# Patient Record
Sex: Female | Born: 1970
Health system: Southern US, Community
[De-identification: ages and names within clinical notes are randomized; demographics above are authoritative.]

---

## 1999-07-14 ENCOUNTER — Other Ambulatory Visit: Admission: RE | Admit: 1999-07-14 | Discharge: 1999-07-14 | Payer: Self-pay | Admitting: Obstetrics and Gynecology

## 2000-01-26 ENCOUNTER — Inpatient Hospital Stay (HOSPITAL_COMMUNITY): Admission: AD | Admit: 2000-01-26 | Discharge: 2000-01-29 | Payer: Self-pay | Admitting: Obstetrics and Gynecology

## 2000-01-26 ENCOUNTER — Encounter: Payer: Self-pay | Admitting: Obstetrics and Gynecology

## 2000-01-26 ENCOUNTER — Encounter (INDEPENDENT_AMBULATORY_CARE_PROVIDER_SITE_OTHER): Payer: Self-pay

## 2000-03-05 ENCOUNTER — Other Ambulatory Visit: Admission: RE | Admit: 2000-03-05 | Discharge: 2000-03-05 | Payer: Self-pay | Admitting: Obstetrics and Gynecology

## 2000-03-15 ENCOUNTER — Encounter: Admission: RE | Admit: 2000-03-15 | Discharge: 2000-06-13 | Payer: Self-pay | Admitting: Obstetrics and Gynecology

## 2003-07-15 ENCOUNTER — Other Ambulatory Visit: Admission: RE | Admit: 2003-07-15 | Discharge: 2003-07-15 | Payer: Self-pay | Admitting: Obstetrics and Gynecology

## 2004-02-11 ENCOUNTER — Encounter (INDEPENDENT_AMBULATORY_CARE_PROVIDER_SITE_OTHER): Payer: Self-pay | Admitting: *Deleted

## 2004-02-11 ENCOUNTER — Inpatient Hospital Stay (HOSPITAL_COMMUNITY): Admission: AD | Admit: 2004-02-11 | Discharge: 2004-02-14 | Payer: Self-pay | Admitting: Obstetrics and Gynecology

## 2008-12-03 ENCOUNTER — Encounter: Admission: RE | Admit: 2008-12-03 | Discharge: 2008-12-03 | Payer: Self-pay | Admitting: Obstetrics and Gynecology

## 2010-12-08 NOTE — Discharge Summary (Signed)
[name], [name]                         ACCOUNT NO.:  [id number]   MEDICAL RECORD NO.:  [id number]                   PATIENT TYPE:  INP   LOCATION:  9110                                 FACILITY:  [facility]   PHYSICIAN:  Ilda Mori, M.D.                DATE OF BIRTH:  12/26/1970   DATE OF ADMISSION:  02/11/2004  DATE OF DISCHARGE:  02/14/2004                                 DISCHARGE SUMMARY   FINAL DIAGNOSES:  1. Intrauterine pregnancy at term.  2. Active labor.  3. Breech presentation.  4. Desire for permanent sterilization.   PROCEDURE:  Primary low transverse cesarean section and bilateral tubal  ligation using Pomeroy method.  Surgeon:  Dr. Annamaria Helling.  Complications:  None.   This 40 year old G2 P1 presents on the morning of February 11, 2004 in active  labor.  The patient was about 5 cm dilated upon admission.  The patient's  antepartum course up to this point had been uncomplicated.  She did have  negative group B strep cultures obtained in the office.  The patient was  admitted.  She was allowed to labor.  She progressed to about 9 cm of  dilation at which point she had rupture of membranes.  At this point it  became obvious the baby's presentation was not vertex and an ultrasound  confirmed a breech presentation at that time.  A discussion was carried out  with the patient and her husband and decision was made to proceed with a  cesarean section secondary to this presentation and for a tubal  sterilization procedure as well.  The patient was taken to the operating  room on February 11, 2004 where a primary low transverse cesarean section was  performed with the delivery of a 6-pound 7-ounce female infant with Apgars  of 8 and 9.  At this point tubal sterilization procedure was performed  without complications.  The patient's postoperative course was benign  without significant fevers.  She was felt ready for discharge on  postoperative day #3.  She was sent home on a  regular diet, told to decrease  activities, told to continue prenatal vitamins, was given Tylox #25 one to  two q.4h. as needed for pain, was told she could use over-the-counter Motrin  as needed, was to follow up in the office in 4 weeks.   LABORATORY DATA ON DISCHARGE:  The patient had a hemoglobin of 11.0, white  blood cell count of 16.3.     Leilani Able, P.A.-C.                Ilda Mori, M.D.    MB/MEDQ  D:  03/09/2004  T:  03/09/2004  Job:  [id number]

## 2010-12-08 NOTE — Op Note (Signed)
NAMEASHMI, Lisa Benson                         ACCOUNT NO.:  [id number]   MEDICAL RECORD NO.:  [id number]                   PATIENT TYPE:  INP   LOCATION:  9160                                 FACILITY:  [facility]   PHYSICIAN:  Gerrit Friends. Aldona Bar, M.D.                DATE OF BIRTH:  [birth year]   DATE OF PROCEDURE:  02/11/2004  DATE OF DISCHARGE:                                 OPERATIVE REPORT   PREOPERATIVE DIAGNOSES:  Term pregnancy active labor breech presentation and  desire for permanent elective sterilization.   POSTOPERATIVE DIAGNOSES:  Term pregnancy active labor breech presentation  and desire for permanent elective sterilization.  Delivery of 6 pound 7  ounce female infant Apgar's of 8 & 9 and pathology pending on segments of  each fallopian tube.   PROCEDURE:  Primary low transverse cesarean section and Pomeroy tubal  sterilization for permanent elective sterilization.   SURGEON:  Gerrit Friends. Aldona Bar, M.D.   ANESTHESIA:  Subarachnoid block, Quillian Quince, M.D.   HISTORY:  This 40 year old, gravida 2, para 1 presented to [facility]  on the morning of July 22 in labor. When I first examined her, she was about  5 cm dilated, contracting, comfortable and on examination membranes were  intact and the presentations felt like perhaps __________ vertex.  The  patient was allowed to labor and progress to about 9 cm of dilatation at  which time I examined her again and ruptured her membranes. It became  obvious at that time that the baby's presentation was probably not vertex.  An ultrasound confirmed a breech and most likely a frank breech. A  discussion was carried out at this time with the patient and her husband and  a decision was made to proceed with a low transverse cesarean section for  delivery and accomplish a tubal sterilization procedure as well as the  patient was desirous of a permanent elective sterilization procedure. She  was taken to the operating room where a  subarachnoid block was carried out  by Dr. Arby Barrette.   DESCRIPTION OF PROCEDURE:  Once in the operating room and once the  subarachnoid block had been carried out, a Foley catheter was placed. At  this time, the patient was placed in a supine position slightly tilted to  the left and after good anesthetic levels were noted with the patient being  prepped and draped according the procedure was begun.   A Pfannenstiel incision was made and with minimal difficulty dissected down  sharply to and through the fascia in a low transverse fashion with  hemostasis created at each layer. The subfascial space was created  inferiorly and superiorly, muscle separated in the midline, peritoneum  identified and entered appropriately with care taken to avoid the bladder  inferiorly and the bowel superiorly. At this time, the vesicouterine  peritoneum was incised in a low transverse fashion and pushed off the lower  uterine  segment with ease. Sharp incisions to the lower segment was then  carried out using the Metzenbaum scissors and extended laterally without  difficulty. At this time from a frank breech presentation, a viable female  infant which cried spontaneously once it was delivered using a modified  Loveset maneuver.  Once the baby was delivered and the cord was clamped and  cut the infant was passed off to the waiting team. Subsequent weight was  found to be 6 pounds 7 ounces, Apgar's 8 & 9 and the baby went to the  nursery in good condition.   After the placenta was delivered intact, the uterus delivered from the  abdominal incision and rendered free of any remaining products of  conception. Good uterine contractility was afforded, was slowly given  intravenous Pitocin and manual stimulation. At this time, the uterine  incision was closed using #1 Vicryl in a running locking fashion. This was  oversewn with several figure-of-eight #1 Vicryl's.  Attention at this time  was turned to the tubes  and ovaries which appeared completely normal. A  segment was elevated in the mid portion of the left fallopian tube and a  free tie of #1 plain catgut suture tied down about the knuckle and the  knuckle was excised and sent to pathology with good hemostasis noted.  A  similar procedure was carried out on the right fallopian tube.   At this time, the uterine incision was again inspected and noted to be  hemostatic.  The uterus this time was replaced into the abdominal cavity and  after the abdomen was lavaged of all free blood and clot and after the  vesicouterine peritoneum had been reapproximated loosely using interrupted  #0 Vicryl, closure of the abdomen was begun.  The abdominoperitoneum was  closed using #0 Vicryl in a running fashion and muscle secured with same.  This was accomplished after all counts were noted to be correct and no  foreign bodies were noted to be remaining in the abdominal cavity. At this  time assured of good fascial hemostasis, the fascia was then reapproximated  using #0 Vicryl from angle to midline bilaterally. The subcutaneous tissue  was rendered hemostatic and staples were then used to close the skin. A  sterile pressure dressing was applied and the patient at this time was  transported to the recovery room in satisfactory condition having tolerated  the procedure well.  Estimated blood loss 500 mL. All counts correct x2.  Pathologic specimen consisted of a segment of each fallopian tube.   In summary, this patient presented in labor at term, was found to have a  breech presentation and was taken to the operating room for a primary low  transverse cesarean section for delivery and at the same time underwent a  tubal sterilization procedure according to her wishes.  At the conclusion of  the procedure, both mother and baby were doing well in their respective  recovery areas.                                               Gerrit Friends. Aldona Bar, M.D.   RMW/MEDQ   D:  02/11/2004  T:  02/12/2004  Job:  [id number]

## 2011-03-27 ENCOUNTER — Other Ambulatory Visit: Payer: Self-pay | Admitting: Obstetrics & Gynecology

## 2012-04-23 ENCOUNTER — Other Ambulatory Visit: Payer: Self-pay | Admitting: Obstetrics & Gynecology

## 2012-04-23 DIAGNOSIS — Z1231 Encounter for screening mammogram for malignant neoplasm of breast: Secondary | ICD-10-CM

## 2012-04-29 ENCOUNTER — Ambulatory Visit
Admission: RE | Admit: 2012-04-29 | Discharge: 2012-04-29 | Disposition: A | Discharge status: Home / Self Care | Payer: BC Managed Care – PPO | Source: Ambulatory Visit | Attending: Obstetrics & Gynecology | Admitting: Obstetrics & Gynecology

## 2012-04-29 DIAGNOSIS — Z1231 Encounter for screening mammogram for malignant neoplasm of breast: Secondary | ICD-10-CM

## 2012-05-05 ENCOUNTER — Other Ambulatory Visit: Payer: Self-pay | Admitting: Obstetrics & Gynecology

## 2012-05-05 DIAGNOSIS — R928 Other abnormal and inconclusive findings on diagnostic imaging of breast: Secondary | ICD-10-CM

## 2012-05-12 ENCOUNTER — Ambulatory Visit
Admission: RE | Admit: 2012-05-12 | Discharge: 2012-05-12 | Disposition: A | Discharge status: Home / Self Care | Payer: BC Managed Care – PPO | Source: Ambulatory Visit | Attending: Obstetrics & Gynecology | Admitting: Obstetrics & Gynecology

## 2012-05-12 DIAGNOSIS — R928 Other abnormal and inconclusive findings on diagnostic imaging of breast: Secondary | ICD-10-CM

## 2012-12-22 ENCOUNTER — Other Ambulatory Visit: Payer: Self-pay | Admitting: Obstetrics & Gynecology

## 2012-12-22 DIAGNOSIS — R921 Mammographic calcification found on diagnostic imaging of breast: Secondary | ICD-10-CM

## 2013-01-05 ENCOUNTER — Ambulatory Visit
Admission: RE | Admit: 2013-01-05 | Discharge: 2013-01-05 | Disposition: A | Discharge status: Home / Self Care | Payer: BC Managed Care – PPO | Source: Ambulatory Visit

## 2013-01-05 ENCOUNTER — Other Ambulatory Visit: Payer: Self-pay | Admitting: Obstetrics & Gynecology

## 2013-01-05 ENCOUNTER — Ambulatory Visit
Admission: RE | Admit: 2013-01-05 | Discharge: 2013-01-05 | Disposition: A | Discharge status: Home / Self Care | Payer: BC Managed Care – PPO | Source: Ambulatory Visit | Attending: Obstetrics & Gynecology | Admitting: Obstetrics & Gynecology

## 2013-01-05 DIAGNOSIS — R921 Mammographic calcification found on diagnostic imaging of breast: Secondary | ICD-10-CM

## 2014-07-13 ENCOUNTER — Other Ambulatory Visit: Payer: Self-pay | Admitting: Obstetrics & Gynecology

## 2014-07-15 LAB — CYTOLOGY - PAP

## 2014-09-07 ENCOUNTER — Other Ambulatory Visit: Payer: Self-pay | Admitting: Dermatology

## 2016-06-06 ENCOUNTER — Other Ambulatory Visit: Payer: Self-pay | Admitting: Obstetrics & Gynecology

## 2016-06-06 DIAGNOSIS — Z1231 Encounter for screening mammogram for malignant neoplasm of breast: Secondary | ICD-10-CM

## 2016-06-29 ENCOUNTER — Ambulatory Visit: Payer: Self-pay

## 2016-07-06 ENCOUNTER — Ambulatory Visit
Admission: RE | Admit: 2016-07-06 | Discharge: 2016-07-06 | Disposition: A | Discharge status: Home / Self Care | Payer: Commercial Managed Care - PPO | Source: Ambulatory Visit | Attending: Obstetrics & Gynecology | Admitting: Obstetrics & Gynecology

## 2016-07-06 DIAGNOSIS — Z1231 Encounter for screening mammogram for malignant neoplasm of breast: Secondary | ICD-10-CM

## 2016-07-10 ENCOUNTER — Other Ambulatory Visit: Payer: Self-pay | Admitting: Obstetrics & Gynecology

## 2016-07-10 DIAGNOSIS — R928 Other abnormal and inconclusive findings on diagnostic imaging of breast: Secondary | ICD-10-CM

## 2016-07-24 ENCOUNTER — Ambulatory Visit
Admission: RE | Admit: 2016-07-24 | Discharge: 2016-07-24 | Disposition: A | Discharge status: Home / Self Care | Payer: Commercial Managed Care - PPO | Source: Ambulatory Visit | Attending: Obstetrics & Gynecology | Admitting: Obstetrics & Gynecology

## 2016-07-24 DIAGNOSIS — R928 Other abnormal and inconclusive findings on diagnostic imaging of breast: Secondary | ICD-10-CM

## 2017-11-29 ENCOUNTER — Other Ambulatory Visit: Payer: Self-pay | Admitting: Dermatology

## 2017-11-29 DIAGNOSIS — C4491 Basal cell carcinoma of skin, unspecified: Secondary | ICD-10-CM

## 2017-11-29 HISTORY — DX: Basal cell carcinoma of skin, unspecified: C44.91

## 2018-03-19 DIAGNOSIS — N951 Menopausal and female climacteric states: Secondary | ICD-10-CM | POA: Diagnosis not present

## 2018-03-19 DIAGNOSIS — N959 Unspecified menopausal and perimenopausal disorder: Secondary | ICD-10-CM | POA: Diagnosis not present

## 2018-04-09 DIAGNOSIS — C44319 Basal cell carcinoma of skin of other parts of face: Secondary | ICD-10-CM | POA: Diagnosis not present

## 2018-08-20 DIAGNOSIS — N959 Unspecified menopausal and perimenopausal disorder: Secondary | ICD-10-CM | POA: Diagnosis not present

## 2018-08-20 DIAGNOSIS — E559 Vitamin D deficiency, unspecified: Secondary | ICD-10-CM | POA: Diagnosis not present

## 2018-08-20 DIAGNOSIS — R5383 Other fatigue: Secondary | ICD-10-CM | POA: Diagnosis not present

## 2019-02-25 DIAGNOSIS — Z20828 Contact with and (suspected) exposure to other viral communicable diseases: Secondary | ICD-10-CM | POA: Diagnosis not present

## 2019-04-13 DIAGNOSIS — Z01419 Encounter for gynecological examination (general) (routine) without abnormal findings: Secondary | ICD-10-CM | POA: Diagnosis not present

## 2019-04-13 DIAGNOSIS — Z124 Encounter for screening for malignant neoplasm of cervix: Secondary | ICD-10-CM | POA: Diagnosis not present

## 2019-05-02 DIAGNOSIS — Z23 Encounter for immunization: Secondary | ICD-10-CM | POA: Diagnosis not present

## 2019-06-27 ENCOUNTER — Other Ambulatory Visit (INDEPENDENT_AMBULATORY_CARE_PROVIDER_SITE_OTHER): Payer: Self-pay | Admitting: Internal Medicine

## 2019-07-28 ENCOUNTER — Other Ambulatory Visit (INDEPENDENT_AMBULATORY_CARE_PROVIDER_SITE_OTHER): Payer: Self-pay | Admitting: Internal Medicine

## 2019-07-28 MED ORDER — PROGESTERONE MICRONIZED 100 MG PO CAPS: 100.0000 mg | ORAL_CAPSULE | Freq: Every day | ORAL | 4 refills | Status: DC

## 2019-08-20 ENCOUNTER — Ambulatory Visit (INDEPENDENT_AMBULATORY_CARE_PROVIDER_SITE_OTHER): Payer: Commercial Managed Care - PPO | Admitting: Internal Medicine

## 2019-08-24 ENCOUNTER — Other Ambulatory Visit: Payer: Self-pay | Admitting: Dermatology

## 2019-08-24 DIAGNOSIS — C4491 Basal cell carcinoma of skin, unspecified: Secondary | ICD-10-CM

## 2019-08-24 DIAGNOSIS — C44319 Basal cell carcinoma of skin of other parts of face: Secondary | ICD-10-CM | POA: Diagnosis not present

## 2019-08-24 DIAGNOSIS — C44219 Basal cell carcinoma of skin of left ear and external auricular canal: Secondary | ICD-10-CM | POA: Diagnosis not present

## 2019-08-24 DIAGNOSIS — C4492 Squamous cell carcinoma of skin, unspecified: Secondary | ICD-10-CM

## 2019-08-24 HISTORY — DX: Basal cell carcinoma of skin, unspecified: C44.91

## 2019-08-24 HISTORY — DX: Squamous cell carcinoma of skin, unspecified: C44.92

## 2019-09-23 DIAGNOSIS — R69 Illness, unspecified: Secondary | ICD-10-CM | POA: Diagnosis not present

## 2019-10-06 DIAGNOSIS — R69 Illness, unspecified: Secondary | ICD-10-CM | POA: Diagnosis not present

## 2019-10-07 DIAGNOSIS — Z792 Long term (current) use of antibiotics: Secondary | ICD-10-CM | POA: Diagnosis not present

## 2019-10-07 DIAGNOSIS — Z85828 Personal history of other malignant neoplasm of skin: Secondary | ICD-10-CM | POA: Diagnosis not present

## 2019-10-07 DIAGNOSIS — C44319 Basal cell carcinoma of skin of other parts of face: Secondary | ICD-10-CM | POA: Diagnosis not present

## 2019-10-07 DIAGNOSIS — H61112 Acquired deformity of pinna, left ear: Secondary | ICD-10-CM | POA: Diagnosis not present

## 2019-10-07 DIAGNOSIS — G8918 Other acute postprocedural pain: Secondary | ICD-10-CM | POA: Diagnosis not present

## 2019-10-07 DIAGNOSIS — C44219 Basal cell carcinoma of skin of left ear and external auricular canal: Secondary | ICD-10-CM | POA: Diagnosis not present

## 2019-10-07 DIAGNOSIS — L578 Other skin changes due to chronic exposure to nonionizing radiation: Secondary | ICD-10-CM | POA: Diagnosis not present

## 2019-10-13 DIAGNOSIS — R69 Illness, unspecified: Secondary | ICD-10-CM | POA: Diagnosis not present

## 2019-10-28 DIAGNOSIS — R69 Illness, unspecified: Secondary | ICD-10-CM | POA: Diagnosis not present

## 2019-11-10 DIAGNOSIS — R69 Illness, unspecified: Secondary | ICD-10-CM | POA: Diagnosis not present

## 2019-11-24 DIAGNOSIS — R69 Illness, unspecified: Secondary | ICD-10-CM | POA: Diagnosis not present

## 2020-01-18 ENCOUNTER — Other Ambulatory Visit (INDEPENDENT_AMBULATORY_CARE_PROVIDER_SITE_OTHER): Payer: Self-pay | Admitting: Internal Medicine

## 2020-03-02 ENCOUNTER — Other Ambulatory Visit: Payer: Self-pay | Admitting: Obstetrics & Gynecology

## 2020-03-02 DIAGNOSIS — Z1231 Encounter for screening mammogram for malignant neoplasm of breast: Secondary | ICD-10-CM

## 2020-03-25 ENCOUNTER — Ambulatory Visit: Payer: Self-pay

## 2020-04-06 ENCOUNTER — Ambulatory Visit
Admission: RE | Admit: 2020-04-06 | Discharge: 2020-04-06 | Disposition: A | Discharge status: Home / Self Care | Payer: 59 | Source: Ambulatory Visit | Attending: Obstetrics & Gynecology | Admitting: Obstetrics & Gynecology

## 2020-04-06 ENCOUNTER — Other Ambulatory Visit: Payer: Self-pay

## 2020-04-06 DIAGNOSIS — Z1231 Encounter for screening mammogram for malignant neoplasm of breast: Secondary | ICD-10-CM

## 2020-04-11 ENCOUNTER — Other Ambulatory Visit: Payer: Self-pay | Admitting: Obstetrics & Gynecology

## 2020-04-11 DIAGNOSIS — R928 Other abnormal and inconclusive findings on diagnostic imaging of breast: Secondary | ICD-10-CM

## 2020-04-18 DIAGNOSIS — Z01419 Encounter for gynecological examination (general) (routine) without abnormal findings: Secondary | ICD-10-CM | POA: Diagnosis not present

## 2020-04-22 ENCOUNTER — Other Ambulatory Visit: Payer: Self-pay | Admitting: Obstetrics & Gynecology

## 2020-04-22 ENCOUNTER — Other Ambulatory Visit: Payer: Self-pay

## 2020-04-22 ENCOUNTER — Ambulatory Visit
Admission: RE | Admit: 2020-04-22 | Discharge: 2020-04-22 | Disposition: A | Discharge status: Home / Self Care | Payer: 59 | Source: Ambulatory Visit | Attending: Obstetrics & Gynecology | Admitting: Obstetrics & Gynecology

## 2020-04-22 DIAGNOSIS — R928 Other abnormal and inconclusive findings on diagnostic imaging of breast: Secondary | ICD-10-CM | POA: Diagnosis not present

## 2020-04-22 DIAGNOSIS — R921 Mammographic calcification found on diagnostic imaging of breast: Secondary | ICD-10-CM

## 2020-04-22 DIAGNOSIS — N6002 Solitary cyst of left breast: Secondary | ICD-10-CM | POA: Diagnosis not present

## 2020-05-04 ENCOUNTER — Other Ambulatory Visit: Payer: Self-pay

## 2020-05-04 ENCOUNTER — Ambulatory Visit
Admission: RE | Admit: 2020-05-04 | Discharge: 2020-05-04 | Disposition: A | Discharge status: Home / Self Care | Payer: 59 | Source: Ambulatory Visit | Attending: Obstetrics & Gynecology | Admitting: Obstetrics & Gynecology

## 2020-05-04 DIAGNOSIS — R921 Mammographic calcification found on diagnostic imaging of breast: Secondary | ICD-10-CM

## 2020-05-04 DIAGNOSIS — N6012 Diffuse cystic mastopathy of left breast: Secondary | ICD-10-CM | POA: Diagnosis not present

## 2020-05-16 ENCOUNTER — Other Ambulatory Visit (INDEPENDENT_AMBULATORY_CARE_PROVIDER_SITE_OTHER): Payer: Self-pay | Admitting: Internal Medicine

## 2020-06-14 DIAGNOSIS — Z1211 Encounter for screening for malignant neoplasm of colon: Secondary | ICD-10-CM | POA: Diagnosis not present

## 2020-08-22 DIAGNOSIS — Z1211 Encounter for screening for malignant neoplasm of colon: Secondary | ICD-10-CM | POA: Diagnosis not present

## 2020-11-22 IMAGING — MG DIGITAL SCREENING BILAT W/ TOMO W/ CAD
6 of 10 series · 6 of 30 positions shown · non-contrast
Comparison: Previous exams.

CLINICAL DATA: Screening.

EXAM:
DIGITAL SCREENING BILATERAL MAMMOGRAM WITH TOMO AND CAD

[R MLO synth-2D]
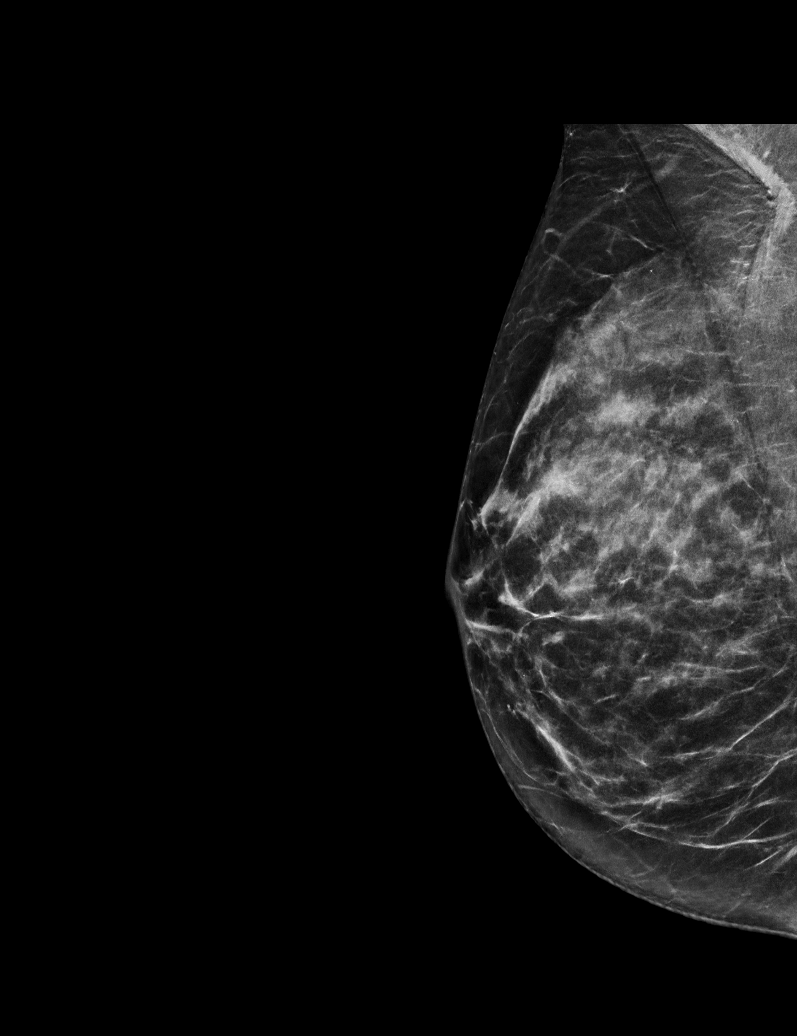

[L MLO synth-2D]
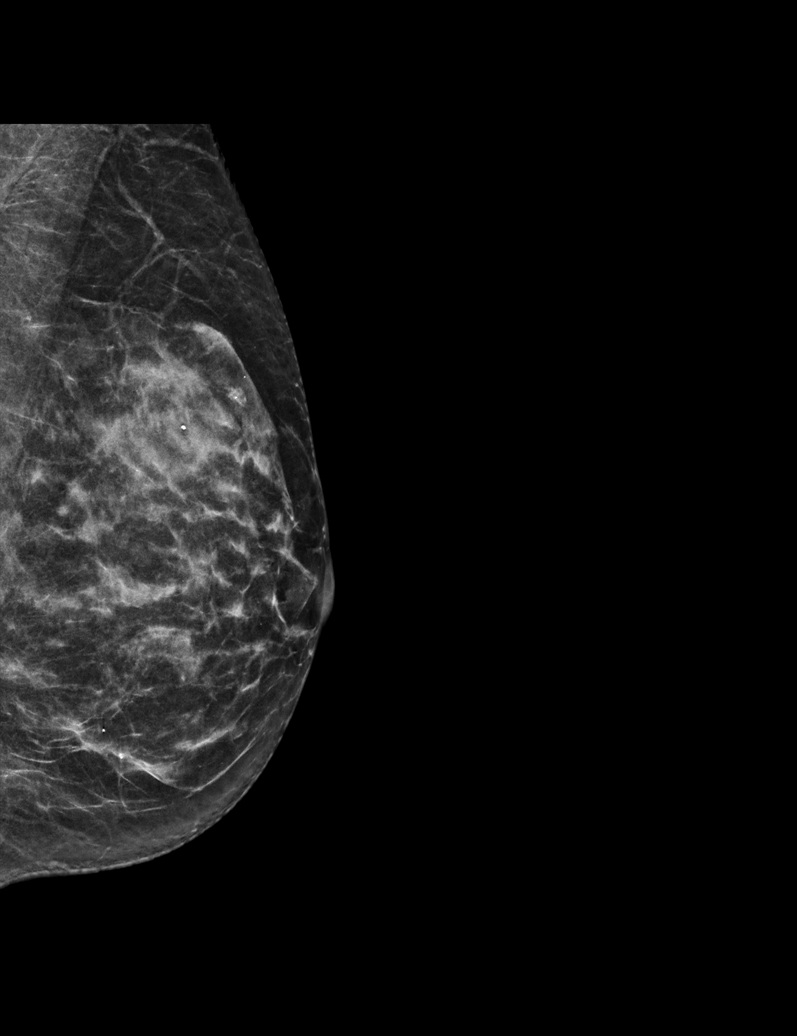

[R CC synth-2D]
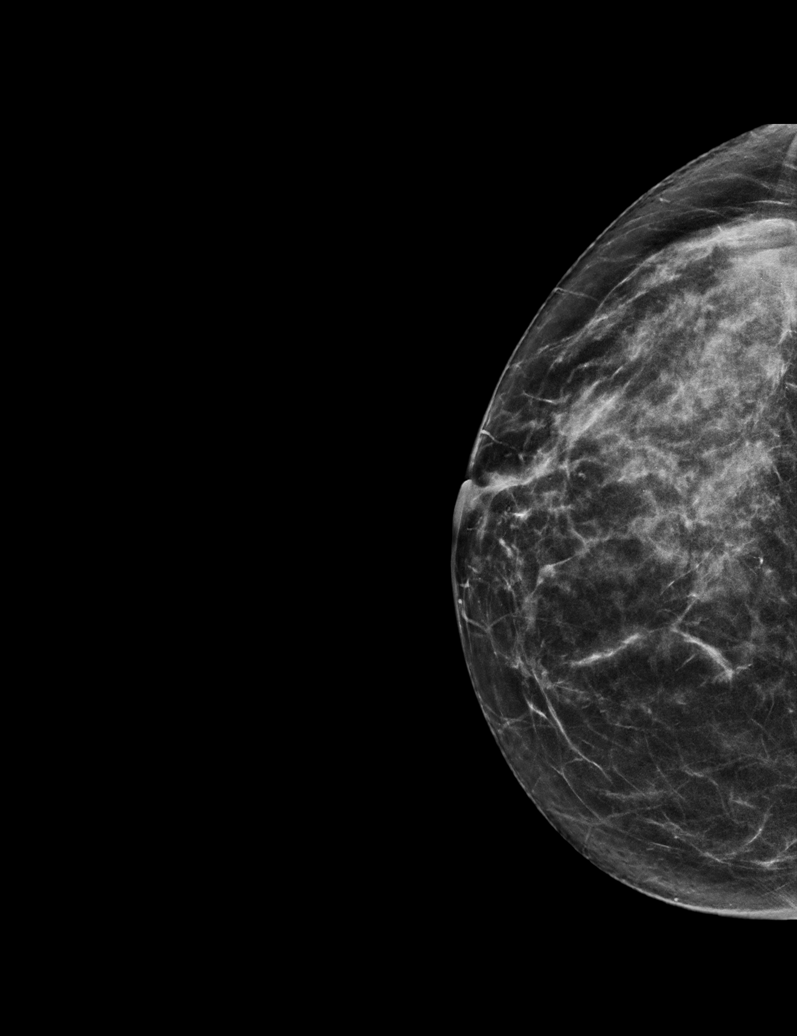

[L XCCL synth-2D]
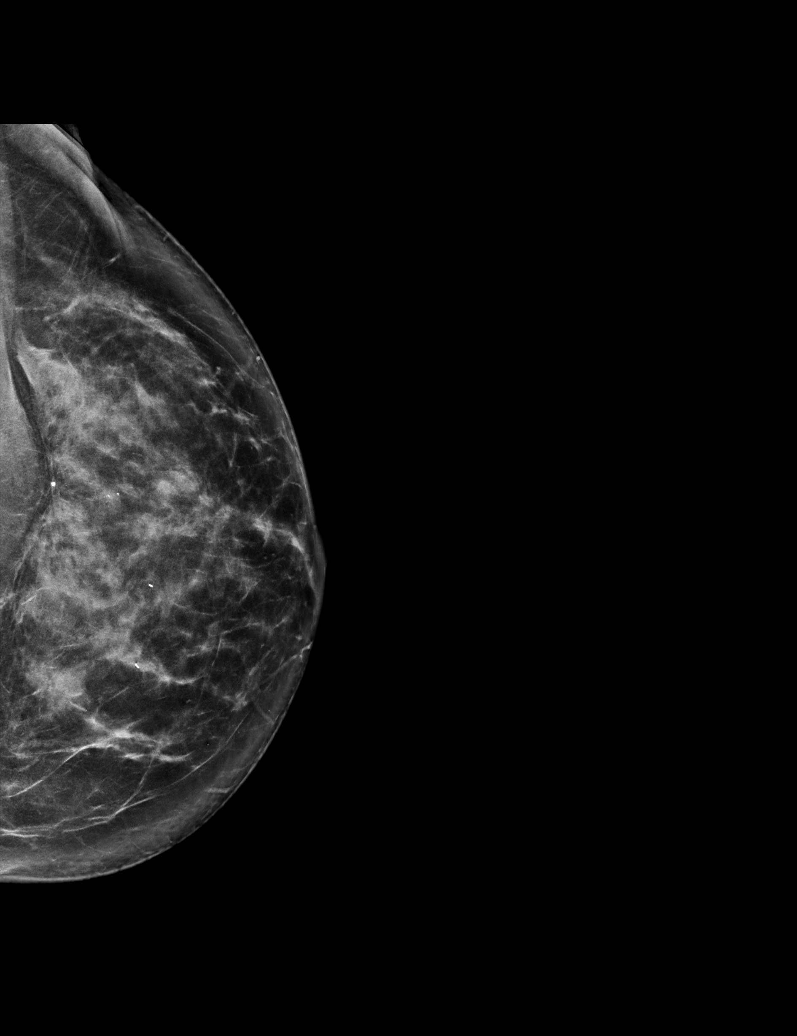

[L CC synth-2D]
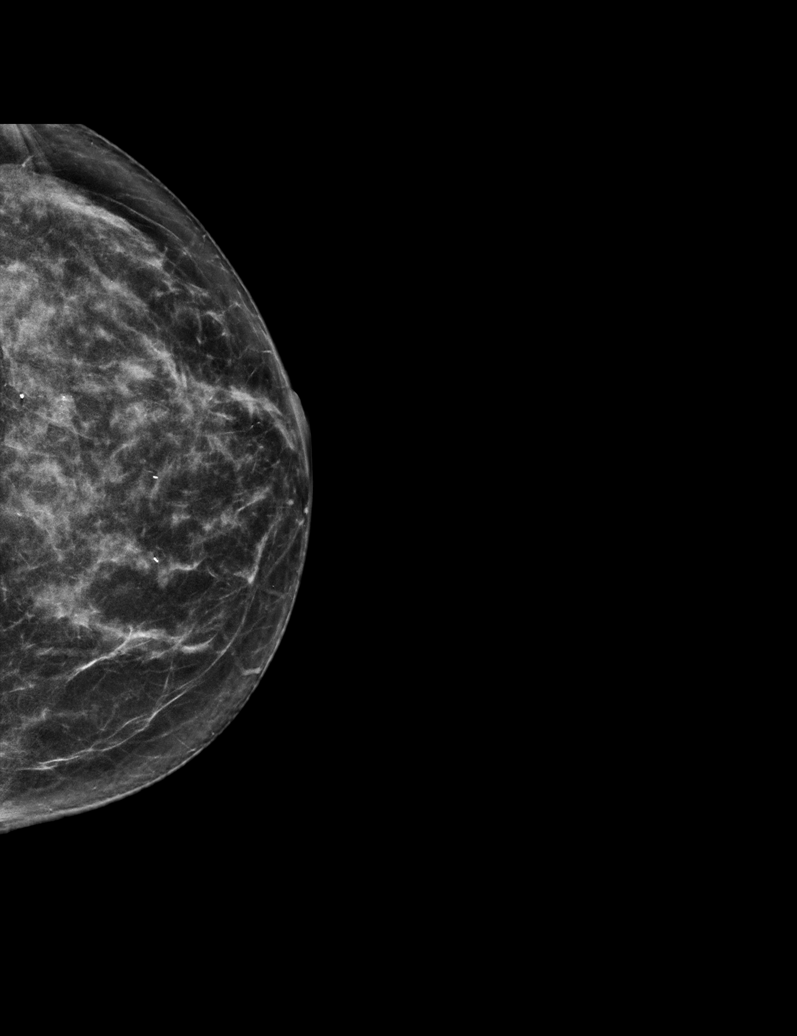

[R CC tomo · tomo slice 29/58.0]
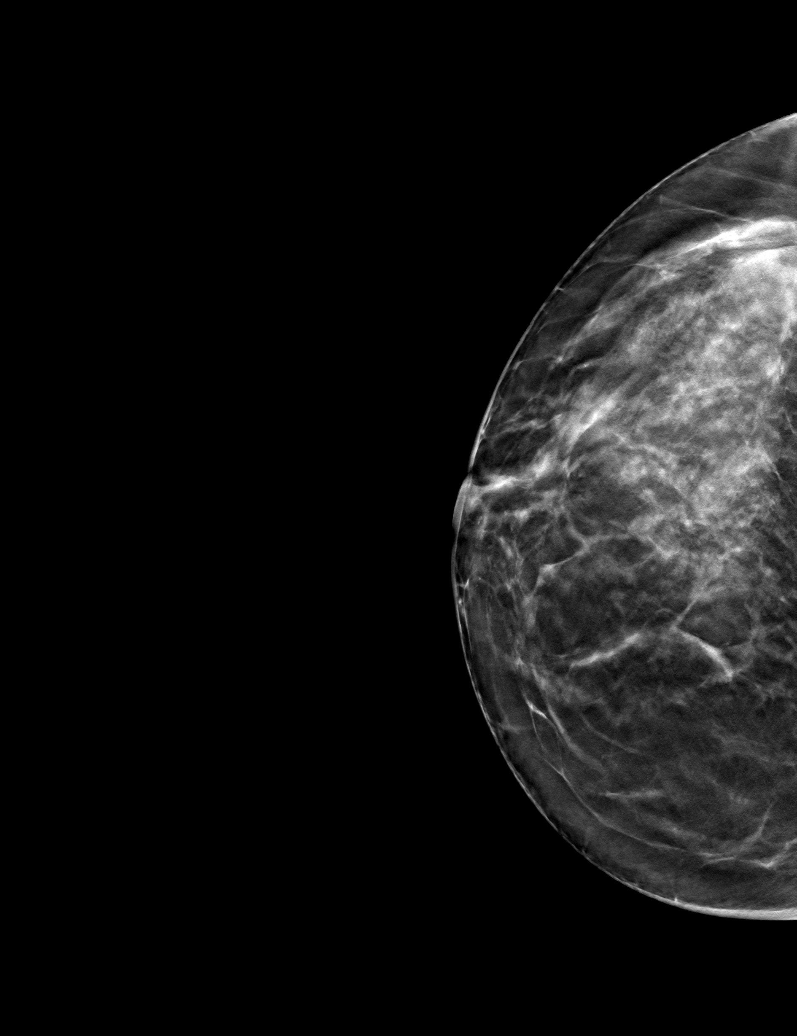

[6 of 30 positions shown; findings below may reference images not displayed]

ACR Breast Density Category c: The breast tissue is heterogeneously
dense, which may obscure small masses.
FINDINGS: In the left breast, possible distortion as well as calcifications
warrants further evaluation. In the right breast, no findings
suspicious for malignancy. Images were processed with CAD.
IMPRESSION: Further evaluation is suggested for possible distortion as well as
calcifications in the left breast.

RECOMMENDATION:
Diagnostic mammogram and possibly ultrasound of the left breast.
(Code:32-G-LLI)

The patient will be contacted regarding the findings, and additional
imaging will be scheduled.

BI-RADS CATEGORY  0: Incomplete. Need additional imaging evaluation
and/or prior mammograms for comparison.

## 2020-12-08 IMAGING — US US BREAST*L* LIMITED INC AXILLA
1 series · 10 of 10 positions shown · non-contrast
Comparison: Previous exam(s).

CLINICAL DATA: Patient returns after screening study for evaluation
of possible LEFT breast distortion and LEFT breast calcifications.

EXAM:
DIGITAL DIAGNOSTIC LEFT MAMMOGRAM WITH CAD AND TOMO
ULTRASOUND LEFT BREAST

[Series 1: us breast*left* limited inc axilla · 0.06mm/px · 10 of 10 slices shown]
[im 1/10]
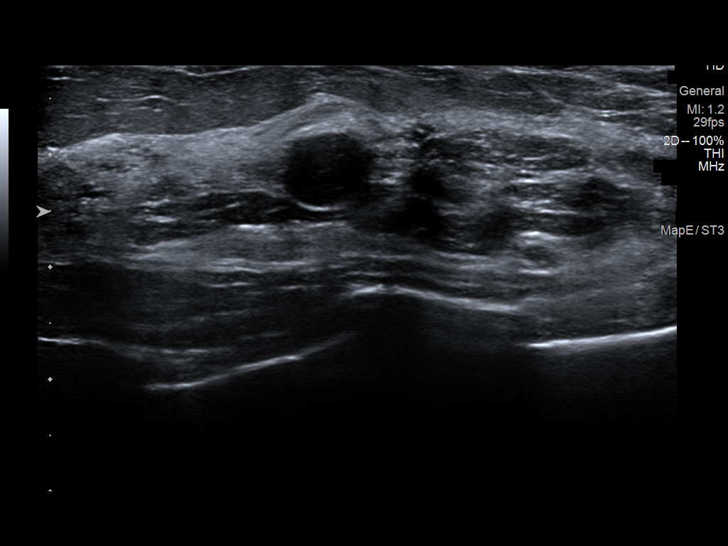
[im 2/10]
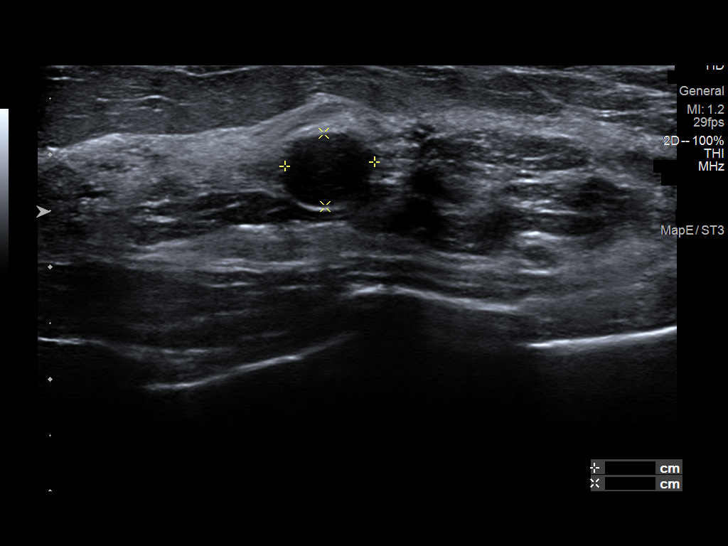
[im 3/10]
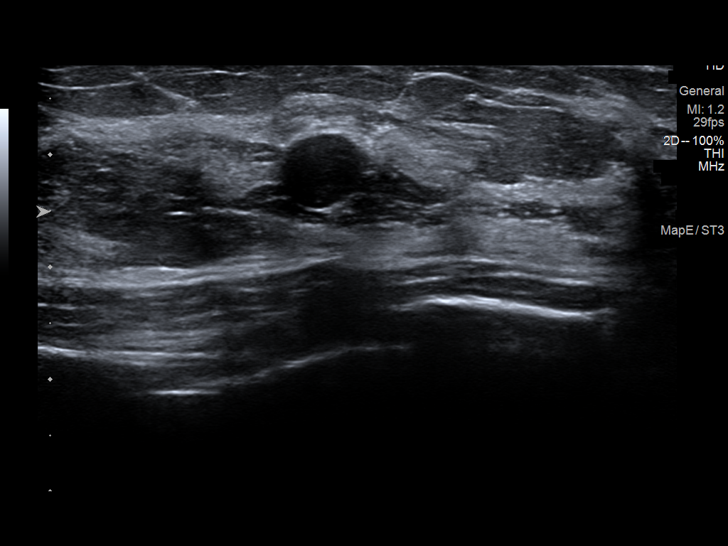
[im 4/10]
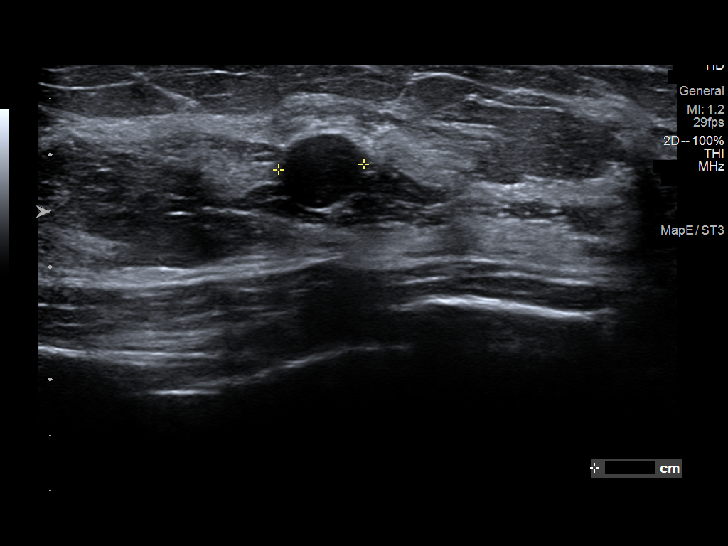
[im 5/10]
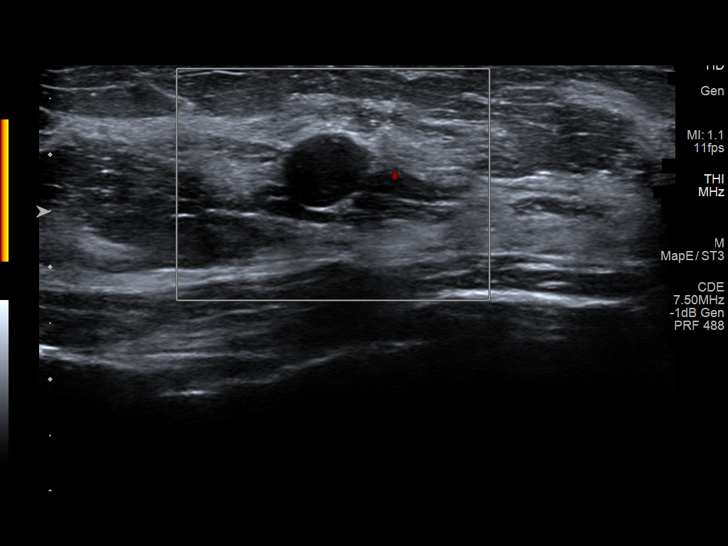
[im 6/10]
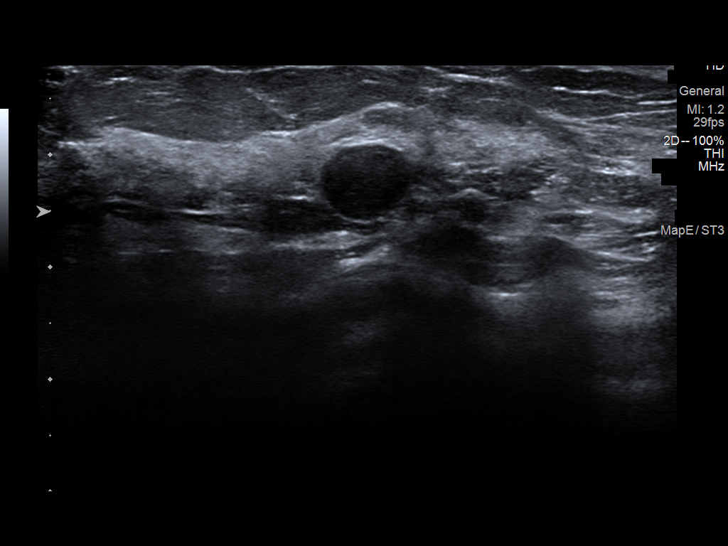
[im 7/10]
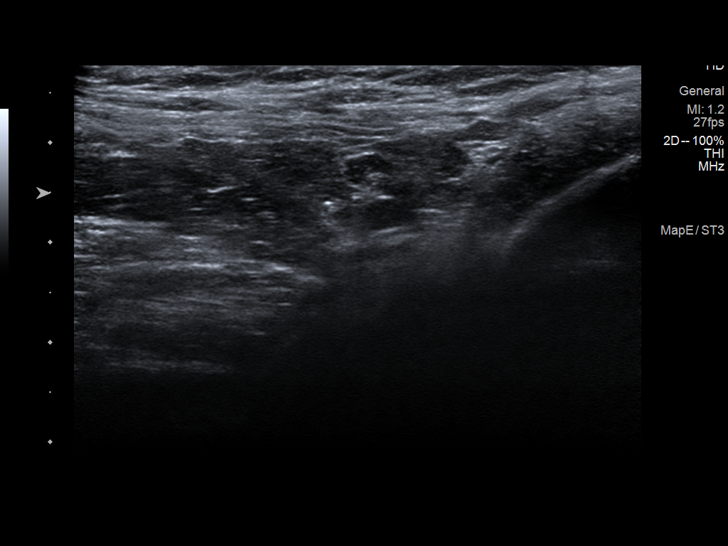
[im 8/10]
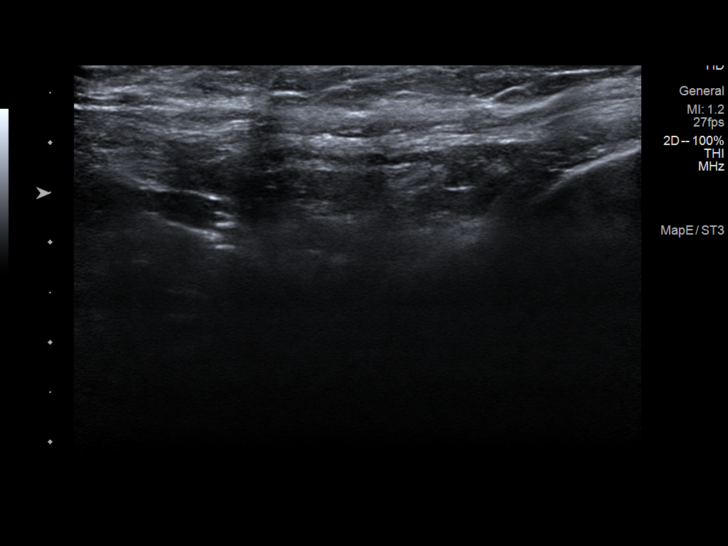
[im 9/10]
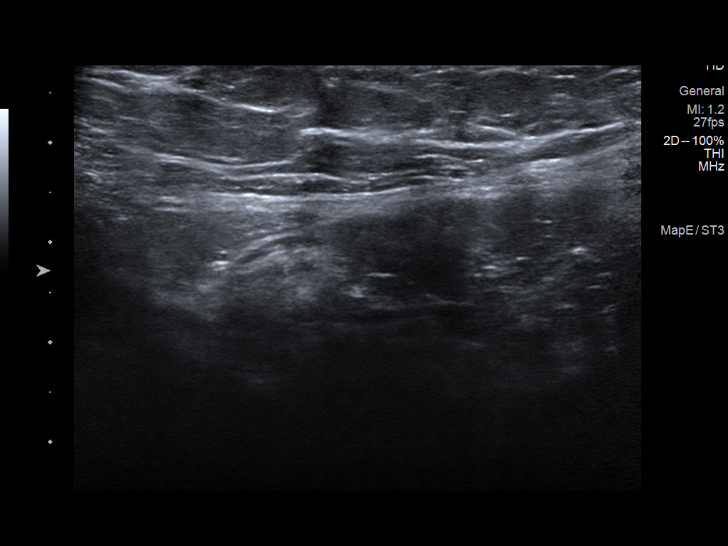
[im 10/10]
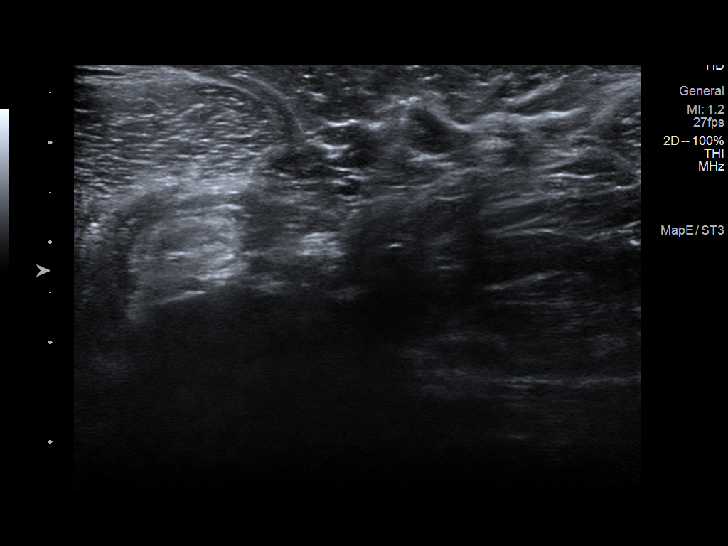

[10 of 10 positions shown; findings below may reference images not displayed]

ACR Breast Density Category c: The breast tissue is heterogeneously
dense, which may obscure small masses.
FINDINGS: Additional 2-D and 3-D images are performed. These views showed no
persistent distortion in the central portion of the LEFT breast. A
partially obscured oval mass is identified in the LATERAL
retroareolar region. Magnified views are performed of calcifications
in the UPPER-OUTER QUADRANT of the breast. These show a 5 millimeter
group of calcifications associated with partially obscured mass.

Mammographic images were processed with CAD.

Targeted ultrasound is performed, showing no distortion in the
retroareolar region of the LEFT breast. Note is made of a nearly
anechoic mass in the 3 o'clock retroareolar location of the LEFT
breast measuring 0.8 x 0.7 x 0.8 centimeters. The appearance is
stable compared with previous ultrasound from 07/24/2016.
IMPRESSION: 1. No persistent distortion of the LEFT breast.
2. Indeterminate calcifications in the UPPER-OUTER QUADRANT of the
LEFT breast.
3. Benign cyst in the retroareolar region of the LEFT breast.

RECOMMENDATION:
Recommend stereotactic guided core biopsy of calcifications in the
UPPER-OUTER QUADRANT of the LEFT breast.

I have discussed the findings and recommendations with the patient.
If applicable, a reminder letter will be sent to the patient
regarding the next appointment.

BI-RADS CATEGORY  4: Suspicious.

## 2021-03-29 ENCOUNTER — Other Ambulatory Visit: Payer: Self-pay

## 2021-03-29 ENCOUNTER — Ambulatory Visit: Payer: Managed Care, Other (non HMO) | Admitting: Dermatology

## 2021-03-29 ENCOUNTER — Encounter: Payer: Self-pay | Admitting: Dermatology

## 2021-03-29 DIAGNOSIS — D485 Neoplasm of uncertain behavior of skin: Secondary | ICD-10-CM

## 2021-03-29 DIAGNOSIS — C44519 Basal cell carcinoma of skin of other part of trunk: Secondary | ICD-10-CM

## 2021-03-29 DIAGNOSIS — Z85828 Personal history of other malignant neoplasm of skin: Secondary | ICD-10-CM

## 2021-03-29 DIAGNOSIS — C44619 Basal cell carcinoma of skin of left upper limb, including shoulder: Secondary | ICD-10-CM

## 2021-03-29 DIAGNOSIS — Z1283 Encounter for screening for malignant neoplasm of skin: Secondary | ICD-10-CM | POA: Diagnosis not present

## 2021-03-29 NOTE — Patient Instructions (Signed)

## 2021-04-03 ENCOUNTER — Telehealth: Payer: Self-pay

## 2021-04-03 ENCOUNTER — Encounter: Payer: Self-pay | Admitting: Dermatology

## 2021-04-03 NOTE — Telephone Encounter (Signed)
Path to patient she has a November surgery visit already

## 2021-04-03 NOTE — Progress Notes (Signed)
   Follow-Up Visit   Subjective  Lisa Benson is a 50 y.o. female who presents for the following: Annual Exam (Left shoulder new lesion x months. History bcc x3).  New growth left front shoulder in young woman with history of nonmelanoma skin cancers Location:  Duration:  Quality:  Associated Signs/Symptoms: Modifying Factors:  Severity:  Timing: Context:   Objective  Well appearing patient in no apparent distress; mood and affect are within normal limits. Waist up examination, no recurrent nonmelanoma skin cancer, no atypical pigmented lesions suggestive of atypical moles.  Right Mid Back Pearly pink 1.2 cm nodule with tiny gray specks; dermoscopy supports BCC       Left Inner Shoulder - Anterior Pearly telangiectatic 8 mm papule typical of BCC         All skin waist up examined.  Is beneath undergarments not fully examined.   Assessment & Plan    Neoplasm of uncertain behavior of skin (2) Right Mid Back  Skin / nail biopsy Type of biopsy: tangential   Informed consent: discussed and consent obtained   Timeout: patient name, date of birth, surgical site, and procedure verified   Procedure prep:  Patient was prepped and draped in usual sterile fashion (Non sterile) Prep type:  Chlorhexidine Anesthesia: the lesion was anesthetized in a standard fashion   Anesthetic:  1% lidocaine w/ epinephrine 1-100,000 local infiltration Instrument used: flexible razor blade   Hemostasis achieved with: ferric subsulfate   Outcome: patient tolerated procedure well   Post-procedure details: sterile dressing applied and wound care instructions given   Dressing type: bandage and petrolatum    Specimen 1 - Surgical pathology Differential Diagnosis: R/O BCC vs SCC  Check Margins: No  Left Inner Shoulder - Anterior  Skin / nail biopsy Type of biopsy: tangential   Informed consent: discussed and consent obtained   Timeout: patient name, date of birth, surgical site,  and procedure verified   Procedure prep:  Patient was prepped and draped in usual sterile fashion (Non sterile) Prep type:  Chlorhexidine Anesthesia: the lesion was anesthetized in a standard fashion   Anesthetic:  1% lidocaine w/ epinephrine 1-100,000 local infiltration Instrument used: flexible razor blade   Hemostasis achieved with: ferric subsulfate   Outcome: patient tolerated procedure well   Post-procedure details: sterile dressing applied and wound care instructions given   Dressing type: bandage and petrolatum    Specimen 2 - Surgical pathology Differential Diagnosis: R/O BCC vs SCC  Check Margins: No  Encounter for screening for malignant neoplasm of skin  Annual skin examination, encouraged to self examine twice annually.  Continued ultraviolet protection.      I, [name], MD, have reviewed all documentation for this visit.  The documentation on 04/03/21 for the exam, diagnosis, procedures, and orders are all accurate and complete.

## 2021-04-03 NOTE — Telephone Encounter (Signed)
-----   Message from [name], MD sent at 04/01/2021  6:40 AM EDT ----- Schedule surgery with Dr. Darene Lamer

## 2021-06-08 ENCOUNTER — Encounter: Payer: Managed Care, Other (non HMO) | Admitting: Dermatology

## 2021-06-22 ENCOUNTER — Ambulatory Visit (INDEPENDENT_AMBULATORY_CARE_PROVIDER_SITE_OTHER): Payer: Managed Care, Other (non HMO) | Admitting: Dermatology

## 2021-06-22 ENCOUNTER — Other Ambulatory Visit: Payer: Self-pay

## 2021-06-22 DIAGNOSIS — C44619 Basal cell carcinoma of skin of left upper limb, including shoulder: Secondary | ICD-10-CM | POA: Diagnosis not present

## 2021-06-22 DIAGNOSIS — C44519 Basal cell carcinoma of skin of other part of trunk: Secondary | ICD-10-CM

## 2021-06-22 NOTE — Patient Instructions (Signed)

## 2021-07-11 ENCOUNTER — Encounter: Payer: Self-pay | Admitting: Dermatology

## 2021-07-11 NOTE — Progress Notes (Signed)
° °  Follow-Up Visit   Subjective  Lisa Benson is a 50 y.o. female who presents for the following: Procedure (Treatment left shoulder and back).  2 biopsy-proven basal cell carcinomas Location:  Duration:  Quality:  Associated Signs/Symptoms: Modifying Factors:  Severity:  Timing: Context:   Objective  Well appearing patient in no apparent distress; mood and affect are within normal limits. Left Shoulder - Anterior Lesion identified by Nurse and Dr.Breella Vanostrand in room.   Right Lower Back Lesion identified by nurse and Dr.Trudie Cervantes in room.     A focused examination was performed including face, neck, back.. Relevant physical exam findings are noted in the Assessment and Plan.   Assessment & Plan    Basal cell carcinoma (BCC) of skin of left upper extremity including shoulder Left Shoulder - Anterior  Destruction of lesion Complexity: simple   Destruction method: electrodesiccation and curettage   Informed consent: discussed and consent obtained   Timeout:  patient name, date of birth, surgical site, and procedure verified Anesthesia: the lesion was anesthetized in a standard fashion   Anesthetic:  1% lidocaine w/ epinephrine 1-100,000 local infiltration Curettage performed in three different directions: Yes   Curettage cycles:  3 Lesion length (cm):  1.2 Lesion width (cm):  1.2 Margin per side (cm):  0 Final wound size (cm):  1.2 Hemostasis achieved with:  ferric subsulfate Outcome: patient tolerated procedure well with no complications   Additional details:  Wound innoculated with 5 fluorouracil solution.  Basal cell carcinoma (BCC) of skin of other part of torso Right Lower Back  Destruction of lesion Complexity: simple   Destruction method: electrodesiccation and curettage   Informed consent: discussed and consent obtained   Timeout:  patient name, date of birth, surgical site, and procedure verified Anesthesia: the lesion was anesthetized in a standard  fashion   Anesthetic:  1% lidocaine w/ epinephrine 1-100,000 local infiltration Curettage performed in three different directions: Yes   Curettage cycles:  3 Lesion length (cm):  1.5 Lesion width (cm):  1.5 Margin per side (cm):  0 Final wound size (cm):  1.5 Hemostasis achieved with:  ferric subsulfate Outcome: patient tolerated procedure well with no complications   Additional details:  Wound innoculated with 5 fluorouracil solution.      I, Lisa Monarch, MD, have reviewed all documentation for this visit.  The documentation on 07/11/21 for the exam, diagnosis, procedures, and orders are all accurate and complete.

## 2022-04-02 ENCOUNTER — Ambulatory Visit: Payer: Managed Care, Other (non HMO) | Admitting: Dermatology

## 2023-11-02 ENCOUNTER — Other Ambulatory Visit: Payer: Self-pay | Admitting: Medical Genetics

## 2023-11-11 ENCOUNTER — Other Ambulatory Visit: Payer: Self-pay

## 2023-11-11 DIAGNOSIS — Z006 Encounter for examination for normal comparison and control in clinical research program: Secondary | ICD-10-CM

## 2023-11-18 LAB — GENECONNECT MOLECULAR SCREEN: Genetic Analysis Overall Interpretation: NEGATIVE
